# Patient Record
Sex: Female | Born: 1950 | Race: White | Hispanic: No
Health system: Southern US, Community
[De-identification: ages and names within clinical notes are randomized; demographics above are authoritative.]

---

## 2015-11-27 ENCOUNTER — Inpatient Hospital Stay (HOSPITAL_COMMUNITY): Payer: Self-pay

## 2015-11-27 ENCOUNTER — Inpatient Hospital Stay (HOSPITAL_COMMUNITY): Payer: MEDICAID

## 2015-11-27 ENCOUNTER — Inpatient Hospital Stay (HOSPITAL_COMMUNITY)
Admission: AD | Admit: 2015-11-27 | Discharge: 2015-12-01 | DRG: 208 | Disposition: A | Discharge status: Home / Self Care | Payer: Self-pay | Source: Other Acute Inpatient Hospital | Attending: Internal Medicine | Admitting: Internal Medicine

## 2015-11-27 DIAGNOSIS — J9690 Respiratory failure, unspecified, unspecified whether with hypoxia or hypercapnia: Secondary | ICD-10-CM

## 2015-11-27 DIAGNOSIS — F17200 Nicotine dependence, unspecified, uncomplicated: Secondary | ICD-10-CM | POA: Diagnosis present

## 2015-11-27 DIAGNOSIS — I10 Essential (primary) hypertension: Secondary | ICD-10-CM | POA: Diagnosis present

## 2015-11-27 DIAGNOSIS — E878 Other disorders of electrolyte and fluid balance, not elsewhere classified: Secondary | ICD-10-CM | POA: Diagnosis present

## 2015-11-27 DIAGNOSIS — J1000 Influenza due to other identified influenza virus with unspecified type of pneumonia: Principal | ICD-10-CM | POA: Diagnosis present

## 2015-11-27 DIAGNOSIS — I509 Heart failure, unspecified: Secondary | ICD-10-CM

## 2015-11-27 DIAGNOSIS — Z978 Presence of other specified devices: Secondary | ICD-10-CM

## 2015-11-27 DIAGNOSIS — I959 Hypotension, unspecified: Secondary | ICD-10-CM | POA: Diagnosis not present

## 2015-11-27 DIAGNOSIS — D696 Thrombocytopenia, unspecified: Secondary | ICD-10-CM | POA: Diagnosis present

## 2015-11-27 DIAGNOSIS — E876 Hypokalemia: Secondary | ICD-10-CM | POA: Diagnosis present

## 2015-11-27 DIAGNOSIS — E871 Hypo-osmolality and hyponatremia: Secondary | ICD-10-CM | POA: Diagnosis present

## 2015-11-27 DIAGNOSIS — J9602 Acute respiratory failure with hypercapnia: Secondary | ICD-10-CM

## 2015-11-27 DIAGNOSIS — Z4659 Encounter for fitting and adjustment of other gastrointestinal appliance and device: Secondary | ICD-10-CM

## 2015-11-27 DIAGNOSIS — A419 Sepsis, unspecified organism: Secondary | ICD-10-CM

## 2015-11-27 DIAGNOSIS — J441 Chronic obstructive pulmonary disease with (acute) exacerbation: Secondary | ICD-10-CM | POA: Diagnosis present

## 2015-11-27 DIAGNOSIS — R5381 Other malaise: Secondary | ICD-10-CM | POA: Diagnosis not present

## 2015-11-27 DIAGNOSIS — J9600 Acute respiratory failure, unspecified whether with hypoxia or hypercapnia: Secondary | ICD-10-CM

## 2015-11-27 DIAGNOSIS — I5033 Acute on chronic diastolic (congestive) heart failure: Secondary | ICD-10-CM | POA: Diagnosis present

## 2015-11-27 DIAGNOSIS — J9601 Acute respiratory failure with hypoxia: Secondary | ICD-10-CM | POA: Diagnosis present

## 2015-11-27 DIAGNOSIS — J101 Influenza due to other identified influenza virus with other respiratory manifestations: Secondary | ICD-10-CM | POA: Insufficient documentation

## 2015-11-27 LAB — MAGNESIUM: Magnesium: 1.9 mg/dL (ref 1.7–2.4)

## 2015-11-27 LAB — COMPREHENSIVE METABOLIC PANEL
ALK PHOS: 70 U/L (ref 38–126)
ALT: 26 U/L (ref 14–54)
ANION GAP: 12 (ref 5–15)
AST: 45 U/L — ABNORMAL HIGH (ref 15–41)
Albumin: 2.8 g/dL — ABNORMAL LOW (ref 3.5–5.0)
BUN: 11 mg/dL (ref 6–20)
CALCIUM: 8.1 mg/dL — AB (ref 8.9–10.3)
CO2: 25 mmol/L (ref 22–32)
Chloride: 89 mmol/L — ABNORMAL LOW (ref 101–111)
Creatinine, Ser: 0.89 mg/dL (ref 0.44–1.00)
GFR calc non Af Amer: >60 mL/min (ref >60)
Glucose, Bld: 128 mg/dL — ABNORMAL HIGH (ref 65–99)
POTASSIUM: 3.3 mmol/L — AB (ref 3.5–5.1)
SODIUM: 126 mmol/L — AB (ref 135–145)
TOTAL PROTEIN: 6 g/dL — AB (ref 6.5–8.1)
Total Bilirubin: 0.2 mg/dL — ABNORMAL LOW (ref 0.3–1.2)

## 2015-11-27 LAB — MRSA PCR SCREENING: MRSA by PCR: NEGATIVE

## 2015-11-27 LAB — POCT I-STAT 3, ART BLOOD GAS (G3+)
ACID-BASE DEFICIT: 2 mmol/L (ref 0.0–2.0)
Bicarbonate: 24.5 mEq/L — ABNORMAL HIGH (ref 20.0–24.0)
O2 Saturation: 97 %
PH ART: 7.317 — AB (ref 7.350–7.450)
TCO2: 26 mmol/L (ref 0–100)
pCO2 arterial: 47.8 mmHg — ABNORMAL HIGH (ref 35.0–45.0)
pO2, Arterial: 96 mmHg (ref 80.0–100.0)

## 2015-11-27 LAB — PROCALCITONIN: PROCALCITONIN: 2.36 ng/mL

## 2015-11-27 LAB — CBC
HCT: 35.7 % — ABNORMAL LOW (ref 36.0–46.0)
HEMOGLOBIN: 12.2 g/dL (ref 12.0–15.0)
MCH: 30.9 pg (ref 26.0–34.0)
MCHC: 34.2 g/dL (ref 30.0–36.0)
MCV: 90.4 fL (ref 78.0–100.0)
PLATELETS: 140 10*3/uL — AB (ref 150–400)
RBC: 3.95 MIL/uL (ref 3.87–5.11)
RDW: 13.7 % (ref 11.5–15.5)
WBC: 12.7 10*3/uL — ABNORMAL HIGH (ref 4.0–10.5)

## 2015-11-27 LAB — TROPONIN I
TROPONIN I: 0.03 ng/mL (ref <0.031)
Troponin I: 0.05 ng/mL — ABNORMAL HIGH (ref <0.031)

## 2015-11-27 LAB — INFLUENZA PANEL BY PCR (TYPE A & B)
H1N1 flu by pcr: NOT DETECTED
INFLAPCR: POSITIVE — AB
INFLBPCR: NEGATIVE

## 2015-11-27 LAB — GLUCOSE, CAPILLARY
GLUCOSE-CAPILLARY: 131 mg/dL — AB (ref 65–99)
Glucose-Capillary: 139 mg/dL — ABNORMAL HIGH (ref 65–99)

## 2015-11-27 LAB — PHOSPHORUS: PHOSPHORUS: 2.8 mg/dL (ref 2.5–4.6)

## 2015-11-27 MED ORDER — MIDAZOLAM HCL 2 MG/2ML IJ SOLN: 2.0000 mg | INTRAMUSCULAR | Status: DC | PRN

## 2015-11-27 MED ORDER — FENTANYL CITRATE (PF) 100 MCG/2ML IJ SOLN
50.0000 ug | Freq: Once | INTRAMUSCULAR | Status: AC
  Administered 2015-11-27: 50 ug via INTRAVENOUS

## 2015-11-27 MED ORDER — SENNOSIDES 8.8 MG/5ML PO SYRP
5.0000 mL | ORAL_SOLUTION | Freq: Two times a day (BID) | ORAL | Status: DC | PRN
  Filled 2015-11-27: qty 5

## 2015-11-27 MED ORDER — ENOXAPARIN SODIUM 40 MG/0.4ML ~~LOC~~ SOLN
40.0000 mg | SUBCUTANEOUS | Status: DC
  Administered 2015-11-27 – 2015-11-30 (×4): 40 mg via SUBCUTANEOUS
  Filled 2015-11-27 (×5): qty 0.4

## 2015-11-27 MED ORDER — FUROSEMIDE 10 MG/ML IJ SOLN
40.0000 mg | Freq: Once | INTRAMUSCULAR | Status: AC
  Administered 2015-11-27: 40 mg via INTRAVENOUS
  Filled 2015-11-27: qty 4

## 2015-11-27 MED ORDER — FAMOTIDINE 40 MG/5ML PO SUSR
20.0000 mg | Freq: Two times a day (BID) | ORAL | Status: DC
  Administered 2015-11-27 – 2015-11-28 (×3): 20 mg
  Filled 2015-11-27 (×3): qty 2.5

## 2015-11-27 MED ORDER — ACETAMINOPHEN 160 MG/5ML PO SOLN
650.0000 mg | ORAL | Status: DC | PRN
  Administered 2015-11-27: 650 mg
  Filled 2015-11-27: qty 20.3

## 2015-11-27 MED ORDER — DEXTROSE 5 % IV SOLN
500.0000 mg | Freq: Every day | INTRAVENOUS | Status: DC
  Administered 2015-11-27: 500 mg via INTRAVENOUS
  Filled 2015-11-27: qty 500

## 2015-11-27 MED ORDER — ACETAMINOPHEN 650 MG RE SUPP: 650.0000 mg | RECTAL | Status: DC | PRN

## 2015-11-27 MED ORDER — MIDAZOLAM HCL 5 MG/ML IJ SOLN
0.0000 mg/h | INTRAMUSCULAR | Status: DC
  Administered 2015-11-27: 3 mg/h via INTRAVENOUS
  Administered 2015-11-27: 10 mg/h via INTRAVENOUS
  Filled 2015-11-27 (×3): qty 10

## 2015-11-27 MED ORDER — OSELTAMIVIR PHOSPHATE 6 MG/ML PO SUSR
75.0000 mg | Freq: Two times a day (BID) | ORAL | Status: DC
Start: 2015-11-27 — End: 2015-11-28
  Administered 2015-11-27 – 2015-11-28 (×3): 75 mg
  Filled 2015-11-27 (×4): qty 12.5

## 2015-11-27 MED ORDER — SODIUM CHLORIDE 0.9 % IV SOLN
25.0000 ug/h | INTRAVENOUS | Status: DC
  Administered 2015-11-27: 100 ug/h via INTRAVENOUS
  Filled 2015-11-27: qty 50

## 2015-11-27 MED ORDER — PREDNISONE 5 MG/ML PO CONC
40.0000 mg | Freq: Every day | ORAL | Status: DC
  Administered 2015-11-27 – 2015-11-29 (×3): 40 mg
  Filled 2015-11-27 (×3): qty 8

## 2015-11-27 MED ORDER — IPRATROPIUM-ALBUTEROL 0.5-2.5 (3) MG/3ML IN SOLN
3.0000 mL | RESPIRATORY_TRACT | Status: DC
  Administered 2015-11-27 – 2015-11-29 (×14): 3 mL via RESPIRATORY_TRACT
  Filled 2015-11-27 (×14): qty 3

## 2015-11-27 MED ORDER — CHLORHEXIDINE GLUCONATE 0.12% ORAL RINSE (MEDLINE KIT)
15.0000 mL | Freq: Two times a day (BID) | OROMUCOSAL | Status: DC
  Administered 2015-11-27: 15 mL via OROMUCOSAL

## 2015-11-27 MED ORDER — ANTISEPTIC ORAL RINSE SOLUTION (CORINZ)
7.0000 mL | OROMUCOSAL | Status: DC
  Administered 2015-11-27 – 2015-11-28 (×11): 7 mL via OROMUCOSAL

## 2015-11-27 MED ORDER — PREDNISONE 5 MG/ML PO CONC
40.0000 mg | Freq: Every day | ORAL | Status: DC
  Filled 2015-11-27: qty 8

## 2015-11-27 MED ORDER — DEXTROSE 5 % IV SOLN
1.0000 g | Freq: Every day | INTRAVENOUS | Status: DC
  Administered 2015-11-27 – 2015-11-28 (×2): 1 g via INTRAVENOUS
  Filled 2015-11-27 (×2): qty 10

## 2015-11-27 MED ORDER — BISACODYL 10 MG RE SUPP: 10.0000 mg | Freq: Every day | RECTAL | Status: DC | PRN

## 2015-11-27 MED ORDER — CHLORHEXIDINE GLUCONATE 0.12% ORAL RINSE (MEDLINE KIT)
15.0000 mL | Freq: Two times a day (BID) | OROMUCOSAL | Status: DC
  Administered 2015-11-27 – 2015-11-28 (×2): 15 mL via OROMUCOSAL

## 2015-11-27 MED ORDER — FENTANYL BOLUS VIA INFUSION
50.0000 ug | INTRAVENOUS | Status: DC | PRN
  Filled 2015-11-27: qty 50

## 2015-11-27 MED ORDER — ANTISEPTIC ORAL RINSE SOLUTION (CORINZ): 7.0000 mL | Freq: Four times a day (QID) | OROMUCOSAL | Status: DC

## 2015-11-27 MED ORDER — SODIUM CHLORIDE 0.9 % IV SOLN: 250.0000 mL | INTRAVENOUS | Status: DC | PRN

## 2015-11-27 MED ORDER — VITAL HIGH PROTEIN PO LIQD
1000.0000 mL | ORAL | Status: DC
  Administered 2015-11-27: 21:00:00
  Administered 2015-11-27: 1000 mL
  Administered 2015-11-28: 02:00:00

## 2015-11-27 NOTE — Progress Notes (Signed)
[facility]  Afternoon rounds Stable, weaning Echo pending  Start lasix Start tube feeds Likely extubation AM  [facility] [place], MD [place] [facility] Pager: [phone] Cell: [phone] After 3pm or if no response, call [phone]

## 2015-11-27 NOTE — Progress Notes (Signed)
Initial Nutrition Assessment  DOCUMENTATION CODES:   Obesity unspecified  INTERVENTION:   If patient unable to be extubated, recommend initiating enteral nutrition with Vital High Protein at 25 mL/hr with Prostat 30 mL BID on day 1.  On day 2, d/c Prostat and increase to goal rate of 50 mL/hr (1200 mL/day) to provide 1200 kcal, 105 grams of protein, and 1003 mL of water.  NUTRITION DIAGNOSIS:   Inadequate oral intake related to inability to eat as evidenced by NPO status.  GOAL:   Patient will meet greater than or equal to 90% of their needs  MONITOR:   Vent status, Labs, Weight trends  REASON FOR ASSESSMENT:   Ventilator    ASSESSMENT:   65 yo with acute hypoxemic/hypercapneic respiratory failure and sepsis 2/2 presumed pulmonary source.  Hx of CHF and COPD. Pt was on CPAP briefly, but had clinical worsening with increased work of breathing and was intubated.    Patient is currently intubated on ventilator support. MV: 10.6 mL/min Temp (24 hrs); Avg: 98.5 F (36.9 C), Min: 98.2 F (36.8 C), Max: 98.6 F (37 C) OG Tube, tip at duodenal bulb  Nutrition Focused Physical Exam was conducted.  Findings included no fat depletion, no muscle depletion, and no edema.   Medications reviewed.  Labs reviewed and include sodium low 126,  potassium low 3.3.  Diet Order:  Diet NPO time specified  Skin:  Reviewed, no issues  Last BM:  Unknown  Height:   Ht Readings from Last 1 Encounters:  11/27/15  (1.6 m)    Weight:   Wt Readings from Last 1 Encounters:  11/27/15 196 lb 3.4 oz (89 kg)    Ideal Body Weight:  52.3 kg  BMI:  Body mass index is 34.77 kg/(m^2).  Estimated Nutritional Needs:   Kcal:  161-0960  Protein:  >/= 105 grams  Fluid:  >/= 1.5 L  EDUCATION NEEDS:   No education needs identified at this time  Doroteo Glassman, Dietetic Intern Pager: [phone]

## 2015-11-27 NOTE — Progress Notes (Signed)
Pt's temp 101.5 and called Dr. Jaquita Rector and he is aware.

## 2015-11-27 NOTE — Progress Notes (Signed)
  Echocardiogram 2D Echocardiogram has been performed.  Janalyn Harder 11/27/2015, 3:48 PM

## 2015-11-27 NOTE — H&P (Signed)
PULMONARY / CRITICAL CARE MEDICINE   Name: Taylor Mays MRN: [id number] DOB: 01-23-1951    ADMISSION DATE:  11/27/2015 CONSULTATION DATE:  11/27/15   CHIEF COMPLAINT:  Shortness of breath  HISTORY OF PRESENT ILLNESS:   Taylor Mays is a 24F who was brought to [facility] by EMS with complaints of shortness of breath. She reportedly had a history of CHF and COPD. She was given steroids, breathing treatments and nitro paste en route. On arrival to the ED she was febrile to 101.5, tachy in the 120s, tachypneic in the 40s adn hypertensive at 163/80. Sats were 88% on room air. She was placed on BiPAP with some improvement.  She was then transitioned to CPAP briefly, but had clinical worsening with increased work of breathing and use of accessory muscles. The decision was ultimately made to intubate. She was dyssnchronous and a versed gtt was uptitrated. She was on  /hr on arrival here.   Labs were remarkable for LA 3.4, trop 0.07,   WBC 14.7, Hgb 14.3, PLT 166. INR 0.96. UA benign. ABG 7.30 / 45.5 / 205 / 24. Cr. 0.9, K 4.0, HCO3 27.3, Na 118, Cl 80. Repeat trop 0.08. BNP 2443. Repeat LA 1.2.   There is some mention in the chart of alcohol use and current smoking.     PAST MEDICAL HISTORY :  She  has no past medical history on Mays. Per report, CHF and COPD/Asthma  PAST SURGICAL HISTORY: She  has no past surgical history on Mays. Unable to obtain.   Allergies not on Mays  No current facility-administered medications on Mays prior to encounter.   No current outpatient prescriptions on Mays prior to encounter.   Home meds unknown.   FAMILY HISTORY:  Taylor Mays.  Unable to obtain   SOCIAL HISTORY: She   reportedly is an every day smoker.  ?  Alcohol use.   REVIEW OF SYSTEMS:   Unable to obtain  VITAL SIGNS: Pulse Rate:  [100] 100 (02/27 0417) SpO2:  [98 %] 98 % (02/27 0417) FiO2 (%):  [40 %] 40 % (02/27 0417)  HEMODYNAMICS:     VENTILATOR SETTINGS: Vent Mode:  [-] PRVC FiO2 (%):  [40 %] 40 % Set Rate:  [16 bmp] 16 bmp Vt Set:  [520 mL] 520 mL PEEP:  [5 cmH20] 5 cmH20 Plateau Pressure:  [24 cmH20] 24 cmH20  INTAKE / OUTPUT:    PHYSICAL EXAMINATION: Physical Exam: Pulse Rate:  [100] 100 (02/27 0417) SpO2:  [98 %] 98 % (02/27 0417) FiO2 (%):  [40 %] 40 % (02/27 0417)  General Well nourished, well developed, sedated, arousable intubated.   HEENT No gross abnormalities. Oropharynx clear. ETT in place  Pulmonary Coarse breath sounds bilaterally with expiratory wheeze. No rales or ronchi on anterior exam. Prolonged expiratory phase. Vent-assisted effort, symmetrical expansion.   Cardiovascular Tachycardic, regular rhythm. S1, s2. No m/r/g. Distal pulses palpable.  Abdomen Soft, non-tender, non-distended, positive bowel sounds, no palpable organomegaly or masses. Normoresonant to percussion.  Musculoskeletal Moves all extremities. No bony abnormalities.  Lymphatics No cervical, supraclavicular or axillary adenopathy.   Neurologic Grossly intact. No focal deficits. Arouses easily  Skin/Integuement No rash, no cyanosis, no clubbing.     LABS:  BMET No results for input(s): NA, K, CL, CO2, BUN, CREATININE, GLUCOSE in the last 168 hours.  Electrolytes No results for input(s): CALCIUM, MG, PHOS in the last 168 hours.  CBC No results for input(s): WBC,  HGB, HCT, PLT in the last 168 hours.  Coag's No results for input(s): APTT, INR in the last 168 hours.  Sepsis Markers No results for input(s): LATICACIDVEN, PROCALCITON, O2SATVEN in the last 168 hours.  ABG No results for input(s): PHART, PCO2ART, PO2ART in the last 168 hours.  Liver Enzymes No results for input(s): AST, ALT, ALKPHOS, BILITOT, ALBUMIN in the last 168 hours.  Cardiac Enzymes No results for input(s): TROPONINI, PROBNP in the last 168 hours.  Glucose No results for input(s): GLUCAP in the last 168 hours.  Imaging No results  found. CXR 11/26/15 Mild degree of nonspecific left retrocardiac pulmonary opacities. This may represent atelectasis or less likely an infectious etiology.   STUDIES:  TTE pending  CULTURES: Blood 2/27 >> Resp quant 2/27 >>  ANTIBIOTICS: Rocephin / azithro 2/27 >> (received one dose of levaquin at OSH)  LINES/TUBES: PIV  DISCUSSION: Taylor Mays is a 65F transferred from OSH with acute hypoxemic / hypercapneic respiratory failure and sepsis 2/2 presumed pulmonary source. She is unable to provide much information about Taylor past medical history, but reports include CHF and COPD. Taylor BNP was elevated at the OSH supporting acute decompensated CHF, although she does not look floridly hypervolemic on exam. She was febrile with elevated WBC, but had no other obvious source of infection (UA benign). Cultures are pending. Treat empirically for sepsis 2/2 CAP, COPD exacerbation, CHF exacerbation and collect additional data.   ASSESSMENT / PLAN:  PULMONARY A: Acute hypoxemic / hypercapneic respiratory failure requiring intubation Presumed COPD exacerbation - no pfts on Mays, but wheezy.   P:   Continue ventilatory support Wean as tolerated; daily SBT Check viral etiologies, tracheal aspirate Diurese Empiric coverage for CAP with ceftriaxone / azithromycin CXR Prednisone  daily x 5 days  CARDIOVASCULAR A:  Acute (on chronic?) heart failure exacerbation - BNP >2000 Tachycardia Hypertension - improved P:  Diurese PRNs for BP Monitor HR  RENAL A:   Hyponatremia / hypochloremia - likely 2/2 volume overload  P:   Diurese Trend lytes, Cr.  GASTROINTESTINAL A:   No acute issues P:   Stress ulcer ppx while intubated  HEMATOLOGIC A:   No acute issues P:  Trend CBC  INFECTIOUS A:   Sepsis 2/2 presumed pulmonary source P:   Empiric ceftriaxone / azithromycin  Empiric Tamiflu Await flu / viral panel / cultures  ENDOCRINE A:   No acute issues P:   Monitor  CBG  NEUROLOGIC A:   Agitation P:   RASS goal: 0 to -1; goal vent synchrony Fentanyl gtt Wean versed gtt to off -> intermittent    FAMILY  - Updates: No family available.   - Inter-disciplinary family meet or Palliative Care meeting due by:  day 7  The patient is critically ill with multiple organ system failure and requires high complexity decision making for assessment and support, frequent evaluation and titration of therapies, advanced monitoring, review of radiographic studies and interpretation of complex data.   Critical Care Time devoted to patient care services, exclusive of separately billable procedures, described in this note is 50 minutes.   Nita Sickle, MD Pulmonary and Critical Care Medicine [facility] Pager: [phone]  11/27/2015, 4:27 AM

## 2015-11-28 ENCOUNTER — Inpatient Hospital Stay (HOSPITAL_COMMUNITY): Payer: MEDICAID

## 2015-11-28 DIAGNOSIS — J9601 Acute respiratory failure with hypoxia: Secondary | ICD-10-CM

## 2015-11-28 DIAGNOSIS — J101 Influenza due to other identified influenza virus with other respiratory manifestations: Secondary | ICD-10-CM | POA: Insufficient documentation

## 2015-11-28 DIAGNOSIS — J9602 Acute respiratory failure with hypercapnia: Secondary | ICD-10-CM

## 2015-11-28 LAB — COMPREHENSIVE METABOLIC PANEL
ALT: 25 U/L (ref 14–54)
AST: 39 U/L (ref 15–41)
Albumin: 2.6 g/dL — ABNORMAL LOW (ref 3.5–5.0)
Alkaline Phosphatase: 64 U/L (ref 38–126)
Anion gap: 13 (ref 5–15)
BUN: 12 mg/dL (ref 6–20)
CHLORIDE: 89 mmol/L — AB (ref 101–111)
CO2: 25 mmol/L (ref 22–32)
CREATININE: 0.99 mg/dL (ref 0.44–1.00)
Calcium: 8.2 mg/dL — ABNORMAL LOW (ref 8.9–10.3)
GFR calc Af Amer: >60 mL/min (ref >60)
GFR, EST NON AFRICAN AMERICAN: 59 mL/min — AB (ref >60)
Glucose, Bld: 131 mg/dL — ABNORMAL HIGH (ref 65–99)
POTASSIUM: 3.1 mmol/L — AB (ref 3.5–5.1)
SODIUM: 127 mmol/L — AB (ref 135–145)
Total Bilirubin: 0.2 mg/dL — ABNORMAL LOW (ref 0.3–1.2)
Total Protein: 5.6 g/dL — ABNORMAL LOW (ref 6.5–8.1)

## 2015-11-28 LAB — POCT I-STAT 3, ART BLOOD GAS (G3+)
BICARBONATE: 26.8 meq/L — AB (ref 20.0–24.0)
O2 Saturation: 98 %
PCO2 ART: 50.6 mmHg — AB (ref 35.0–45.0)
PH ART: 7.331 — AB (ref 7.350–7.450)
Patient temperature: 98.4
TCO2: 28 mmol/L (ref 0–100)
pO2, Arterial: 124 mmHg — ABNORMAL HIGH (ref 80.0–100.0)

## 2015-11-28 LAB — LACTIC ACID, PLASMA
LACTIC ACID, VENOUS: 1.9 mmol/L (ref 0.5–2.0)
LACTIC ACID, VENOUS: 1.5 mmol/L (ref 0.5–2.0)

## 2015-11-28 LAB — RESPIRATORY VIRUS PANEL
Adenovirus: NEGATIVE
Influenza A: POSITIVE — AB
Influenza B: NEGATIVE
Metapneumovirus: NEGATIVE
Parainfluenza 1: NEGATIVE
Parainfluenza 2: NEGATIVE
Parainfluenza 3: NEGATIVE
Respiratory Syncytial Virus A: NEGATIVE
Respiratory Syncytial Virus B: NEGATIVE
Rhinovirus: NEGATIVE

## 2015-11-28 LAB — CBC
HCT: 35.6 % — ABNORMAL LOW (ref 36.0–46.0)
Hemoglobin: 11.8 g/dL — ABNORMAL LOW (ref 12.0–15.0)
MCH: 30.2 pg (ref 26.0–34.0)
MCHC: 33.1 g/dL (ref 30.0–36.0)
MCV: 91 fL (ref 78.0–100.0)
PLATELETS: 138 10*3/uL — AB (ref 150–400)
RBC: 3.91 MIL/uL (ref 3.87–5.11)
RDW: 13.9 % (ref 11.5–15.5)
WBC: 10.6 10*3/uL — AB (ref 4.0–10.5)

## 2015-11-28 LAB — GLUCOSE, CAPILLARY
GLUCOSE-CAPILLARY: 180 mg/dL — AB (ref 65–99)
Glucose-Capillary: 138 mg/dL — ABNORMAL HIGH (ref 65–99)
Glucose-Capillary: 132 mg/dL — ABNORMAL HIGH (ref 65–99)
Glucose-Capillary: 141 mg/dL — ABNORMAL HIGH (ref 65–99)

## 2015-11-28 LAB — PROCALCITONIN: PROCALCITONIN: 1.35 ng/mL

## 2015-11-28 LAB — CORTISOL: CORTISOL PLASMA: 12.9 ug/dL

## 2015-11-28 MED ORDER — POTASSIUM CHLORIDE CRYS ER 20 MEQ PO TBCR
40.0000 meq | EXTENDED_RELEASE_TABLET | Freq: Once | ORAL | Status: AC
  Administered 2015-11-28: 40 meq via ORAL
  Filled 2015-11-28: qty 2

## 2015-11-28 MED ORDER — CETYLPYRIDINIUM CHLORIDE 0.05 % MT LIQD
7.0000 mL | Freq: Two times a day (BID) | OROMUCOSAL | Status: DC
  Administered 2015-11-28 – 2015-11-30 (×5): 7 mL via OROMUCOSAL

## 2015-11-28 MED ORDER — CHLORHEXIDINE GLUCONATE 0.12 % MT SOLN
15.0000 mL | Freq: Two times a day (BID) | OROMUCOSAL | Status: DC
  Administered 2015-11-28 – 2015-11-30 (×6): 15 mL via OROMUCOSAL
  Filled 2015-11-28 (×4): qty 15

## 2015-11-28 MED ORDER — SODIUM CHLORIDE 0.9 % IV SOLN
Freq: Once | INTRAVENOUS | Status: AC
  Administered 2015-11-28: 06:00:00 via INTRAVENOUS

## 2015-11-28 MED ORDER — OSELTAMIVIR PHOSPHATE 75 MG PO CAPS
75.0000 mg | ORAL_CAPSULE | Freq: Two times a day (BID) | ORAL | Status: DC
  Administered 2015-11-28 – 2015-12-01 (×6): 75 mg via ORAL
  Filled 2015-11-28 (×10): qty 1

## 2015-11-28 MED ORDER — FENTANYL CITRATE (PF) 100 MCG/2ML IJ SOLN: 12.5000 ug | INTRAMUSCULAR | Status: DC | PRN

## 2015-11-28 NOTE — Progress Notes (Signed)
Pt's BP 69/46 with MAP 55. Dr. Jaquita Rector made aware and new orders carried out.

## 2015-11-28 NOTE — Care Management Note (Addendum)
Case Management Note  Patient Details  Name: Taylor Mays MRN: [id number] Date of Birth: [birth year]  Subjective/Objective:         Pt admitted with acute respiratory failure with hypoxemia           Action/Plan:  PTA- pt was independent - pt lives with elderly mother in [place].  CM will continue to monitor for disposition needs   Expected Discharge Date:                  Expected Discharge Plan:  Home/Self Care  In-House Referral:     Discharge planning Services  CM Consult  Post Acute Care Choice:    Choice offered to:     DME Arranged:    DME Agency:     HH Arranged:    HH Agency:     Status of Service:  In process, will continue to follow  Medicare Important Message Given:    Date Medicare IM Given:    Medicare IM give by:    Date Additional Medicare IM Given:    Additional Medicare Important Message give by:     If discussed at Long Length of Stay Meetings, dates discussed:    Additional Comments:  Cherylann Parr, RN 11/28/2015, 12:06 PM

## 2015-11-28 NOTE — Progress Notes (Signed)
eLink Physician-Brief Progress Note Patient Name: Taylor Mays DOB: 1951/07/28 MRN: [id number]   Date of Service  11/28/2015  HPI/Events of Note  RN called for low K. Also, pt with decreasing uo. BP 98/71. CXR -- no congestion. Echo with NEF  eICU Interventions  Replace K. Fluid bolus 500 ml x 1. May need maintenance fluid     Intervention Category Intermediate Interventions: Hypovolemia - evaluation and management  [name] 11/28/2015, 6:03 AM

## 2015-11-28 NOTE — Progress Notes (Signed)
Fentanyl 175 ml and 15 ml of Versed wasted in the sink. Full bag of 50 ml of Versed returned to Pharmacy. Witnessed by [employer], RN

## 2015-11-28 NOTE — Procedures (Signed)
Extubation Procedure Note  Patient Details:   Name: Taylor Mays DOB: Feb 08, 1951 MRN: [id number]   Airway Documentation:     Evaluation  O2 sats: stable throughout Complications: No apparent complications Patient did tolerate procedure well. Bilateral Breath Sounds: Rhonchi Suctioning: Airway Yes   Patient extubated to 4L nasal cannula per MD order.  Positive cuff leak noted.  No evidence of stridor.  Patient able to speak post extubation.  Sats currently 95%.  Vitals are stable.  No apparent complications.   Durwin Glaze 11/28/2015, 12:44 PM

## 2015-11-28 NOTE — Progress Notes (Signed)
PULMONARY / CRITICAL CARE MEDICINE   Name: Taylor Mays MRN: [id number] DOB: 1951-08-21    ADMISSION DATE:  11/27/2015 CONSULTATION DATE:  11/27/15   CHIEF COMPLAINT:  Shortness of breath  HISTORY OF PRESENT ILLNESS:   Taylor Mays is a 77F who was brought to [facility] by EMS with complaints of shortness of breath. She reportedly had a history of CHF and COPD. She was given steroids, breathing treatments and nitro paste en route. On arrival to the ED she was febrile to 101.5, tachy in the 120s, tachypneic in the 40s adn hypertensive at 163/80. Sats were 88% on room air. She was placed on BiPAP with some improvement.  She was then transitioned to CPAP briefly, but had clinical worsening with increased work of breathing and use of accessory muscles. The decision was ultimately made to intubate. She was dyssnchronous and a versed gtt was uptitrated.   SUBJECTIVE: Hypotensive overnight; received fluid bolus Patient is comfortable on vent and awake. No complaints  HEMODYNAMICS:    VENTILATOR SETTINGS: Vent Mode:  [-] PRVC FiO2 (%):  [40 %] 40 % Set Rate:  [18 bmp] 18 bmp Vt Set:  [520 mL] 520 mL PEEP:  [5 cmH20] 5 cmH20 Pressure Support:  [8 cmH20] 8 cmH20 Plateau Pressure:  [13 cmH20-17 cmH20] 17 cmH20  INTAKE / OUTPUT: I/O last 3 completed shifts: In: 501.5 [I.V.:229.5; NG/GT:222; IV Piggyback:50] Out: 1225 [Urine:1225]  PHYSICAL EXAMINATION: Temp:  [98.4 F (36.9 C)-101.8 F (38.8 C)] 98.4 F (36.9 C) (02/28 0313) Pulse Rate:  [51-126] 91 (02/28 0600) Resp:  [15-35] 18 (02/28 0600) BP: (69-153)/(46-92) 98/71 mmHg (02/28 0600) SpO2:  [94 %-100 %] 100 % (02/28 0600) FiO2 (%):  [40 %] 40 % (02/28 0400) Weight:  [194 lb 0.1 oz (88 kg)] 194 lb 0.1 oz (88 kg) (02/28 0347)  General Well nourished, well developed, awake,  intubated.   HEENT No gross abnormalities. Oropharynx clear. ETT in place  Pulmonary Coarse breath sounds bilaterally. No wheezes, rales or ronchi  on anterior exam. Prolonged expiratory phase. Normal work of breathing   Cardiovascular RRR. S1, s2. No m/r/g. Distal pulses palpable.  Abdomen Soft, non-tender, non-distended, positive bowel sounds, no palpable organomegaly or masses.   Musculoskeletal Moves all extremities. No bony abnormalities.  Neurologic Grossly intact. No focal deficits. Arouses easily  Skin/Integuement No rash, no cyanosis, no clubbing.     LABS:  BMET  Recent Labs Lab 11/27/15 0835 11/28/15 0213  NA 126* 127*  K 3.3* 3.1*  CL 89* 89*  CO2 25 25  BUN 11 12  CREATININE 0.89 0.99  GLUCOSE 128* 131*    Electrolytes  Recent Labs Lab 11/27/15 0835 11/28/15 0213  CALCIUM 8.1* 8.2*  MG 1.9  --   PHOS 2.8  --     CBC  Recent Labs Lab 11/27/15 0835 11/28/15 0213  WBC 12.7* 10.6*  HGB 12.2 11.8*  HCT 35.7* 35.6*  PLT 140* 138*    Coag's No results for input(s): APTT, INR in the last 168 hours.  Sepsis Markers  Recent Labs Lab 11/27/15 0835 11/28/15 0213 11/28/15 0442  LATICACIDVEN  --  1.9  --   PROCALCITON 2.36  --  1.35    ABG  Recent Labs Lab 11/27/15 0520 11/28/15 0402  PHART 7.317* 7.331*  PCO2ART 47.8* 50.6*  PO2ART 96.0 124.0*    Liver Enzymes  Recent Labs Lab 11/27/15 0835 11/28/15 0213  AST 45* 39  ALT 26 25  ALKPHOS 70 64  BILITOT  0.2* 0.2*  ALBUMIN 2.8* 2.6*    Cardiac Enzymes  Recent Labs Lab 11/27/15 0835 11/27/15 1052 11/27/15 1630  TROPONINI <0.03 0.03 0.05*    Glucose  Recent Labs Lab 11/27/15 0457 11/27/15 2059 11/27/15 2335 11/28/15 0319  GLUCAP 131* 139* 180* 138*    Imaging Dg Chest Port 1 View  11/28/2015  CLINICAL DATA:  Respiratory failure. EXAM: PORTABLE CHEST 1 VIEW COMPARISON:  11/27/2015. FINDINGS: Endotracheal tube and NG tube noted in stable position. Heart size stable. Low lung volumes with basilar atelectasis. Lower lobe infiltrates, particular in the the left lower lobe, cannot be excluded. No pleural effusion or  pneumothorax. IMPRESSION: 1. Lines and tubes in stable position. 2. Low lung volumes with bibasilar atelectasis. Lower lobe infiltrates, particularly the in the left lower lobe, cannot be excluded . Electronically Signed   By: Maisie Fus  Register   On: 11/28/2015 07:04   CXR 11/26/15 Mild degree of nonspecific left retrocardiac pulmonary opacities. This may represent atelectasis or less likely an infectious etiology.   STUDIES:  Echo 2/27 >>G1DD, normal EF  CULTURES: Blood 2/27 >> Resp quant 2/27 >>  ANTIBIOTICS: Rocephin 2/27 >> azithro 2/27 >> Tamiflu 2/27>> (received one dose of levaquin at OSH)  LINES/TUBES: PIV ETT  DISCUSSION: Taylor Mays is a 72F transferred from OSH with acute hypoxemic / hypercapneic respiratory failure and sepsis 2/2 presumed pulmonary source. She is unable to provide much information about her past medical history, but reports include CHF and COPD. Her BNP was elevated at the OSH supporting acute decompensated CHF, although she does not look floridly hypervolemic on exam. She was febrile with elevated WBC, but had no other obvious source of infection (UA benign). Cultures are pending. Treat empirically for sepsis 2/2 CAP, COPD exacerbation, CHF exacerbation and collect additional data.   ASSESSMENT / PLAN:  PULMONARY A: Acute hypoxemic / hypercapneic respiratory failure requiring intubation Presumed COPD exacerbation - no pfts on file, but wheezy.  Flu positive  P:   Continue ventilatory support Wean as tolerated; daily SBT Empiric coverage for CAP with ceftriaxone / azithromycin CXR with atelectasis  Prednisone  daily x 5 days Possible extubation today if passes wean  CARDIOVASCULAR A:  Acute (on chronic?) heart failure exacerbation - BNP >2000 Tachycardia - improved Hypertension - improved; now hypotensive P:  PRNs for BP Monitor HR Echo 2/27 with normal EF, grade 1 diastolic dysfunction   RENAL A:   Hyponatremia / hypochloremia -  likely 2/2 volume overload Hypokalemia P:   Continue Lasix Trend lytes, Cr. Replace lytes as needed Monitor UOP Goal neg balance  GASTROINTESTINAL A:   No acute issues P:   Stress ulcer ppx while intubated Continue TFs  HEMATOLOGIC A:   No acute issues Thrombocytopenia P:  Trend CBC  INFECTIOUS A:   Sepsis 2/2 presumed pulmonary source Flu positive P:   Empiric ceftriaxone / azithromycin  Tamiflu for 5 days Await viral panel / cultures Pct and lactic acid downtrending  ENDOCRINE A:   No acute issues P:   Monitor CBG Cortisol wnl  NEUROLOGIC A:   Agitation P:   RASS goal: 0 to -1; goal vent synchrony Fentanyl gtt Wean versed gtt to off   FAMILY  - Updates: No family available.    Caryl Ada, DO 11/28/2015, 7:25 AM PGY-2, [facility]

## 2015-11-29 DIAGNOSIS — R062 Wheezing: Secondary | ICD-10-CM

## 2015-11-29 DIAGNOSIS — R29898 Other symptoms and signs involving the musculoskeletal system: Secondary | ICD-10-CM

## 2015-11-29 LAB — BASIC METABOLIC PANEL
ANION GAP: 9 (ref 5–15)
CHLORIDE: 90 mmol/L — AB (ref 101–111)
CO2: 33 mmol/L — ABNORMAL HIGH (ref 22–32)
Calcium: 8.7 mg/dL — ABNORMAL LOW (ref 8.9–10.3)
Creatinine, Ser: 0.64 mg/dL (ref 0.44–1.00)
Glucose, Bld: 138 mg/dL — ABNORMAL HIGH (ref 65–99)
POTASSIUM: 3.7 mmol/L (ref 3.5–5.1)
SODIUM: 132 mmol/L — AB (ref 135–145)

## 2015-11-29 MED ORDER — MOMETASONE FURO-FORMOTEROL FUM 100-5 MCG/ACT IN AERO
2.0000 | INHALATION_SPRAY | Freq: Two times a day (BID) | RESPIRATORY_TRACT | Status: DC
  Filled 2015-11-29: qty 8.8

## 2015-11-29 MED ORDER — MOMETASONE FURO-FORMOTEROL FUM 100-5 MCG/ACT IN AERO
2.0000 | INHALATION_SPRAY | Freq: Two times a day (BID) | RESPIRATORY_TRACT | Status: DC
  Administered 2015-11-29 – 2015-12-01 (×4): 2 via RESPIRATORY_TRACT
  Filled 2015-11-29: qty 8.8

## 2015-11-29 MED ORDER — LISINOPRIL 5 MG PO TABS
5.0000 mg | ORAL_TABLET | Freq: Every day | ORAL | Status: DC
  Administered 2015-11-29 – 2015-12-01 (×3): 5 mg via ORAL
  Filled 2015-11-29 (×3): qty 1

## 2015-11-29 MED ORDER — IPRATROPIUM-ALBUTEROL 0.5-2.5 (3) MG/3ML IN SOLN: 3.0000 mL | RESPIRATORY_TRACT | Status: DC | PRN

## 2015-11-29 MED ORDER — PREDNISONE 20 MG PO TABS
30.0000 mg | ORAL_TABLET | Freq: Every day | ORAL | Status: DC
  Administered 2015-11-30 – 2015-12-01 (×2): 30 mg via ORAL
  Filled 2015-11-29 (×2): qty 1

## 2015-11-29 NOTE — Progress Notes (Signed)
Nutrition Follow-up  DOCUMENTATION CODES:   Obesity unspecified  INTERVENTION:   - Provide Mighty Shake po BID with meals, each supplement provides 500 kcal and 23 grams of protein.  - Provide afternoon snack to help patient meet needs.  NUTRITION DIAGNOSIS:   Inadequate oral intake related to poor appetite as evidenced by per patient/family report.  Ongoing  GOAL:   Patient will meet greater than or equal to 90% of their needs  Unmet  MONITOR:   PO intake, Supplement acceptance, Labs, Weight trends  ASSESSMENT:   65 yo with acute hypoxemic/hypercapneic respiratory failure and sepsis 2/2 presumed pulmonary source.  Hx of CHF and COPD. Pt was on CPAP briefly, but had clinical worsening with increased work of breathing and was intubated.   Patient was extubated and tube feedings were stopped on 2/28.  Pt reports poor appetite for about 1 week.  States that prior to this time she had a good appetite and was consuming mostly snacks throughout the day.  Pt reports that she ate about half of the bacon and fruit on her breakfast tray but no eggs or grits.  Her lunch tray was in the room during the visit.  Pt had a few bites of her burger and dessert, about 25% meal completion.    Reports usual body weight of 170#, current weight of 190# from today.  States that she may have lost some weight, although this cannot be verified from chart review.  Labs reviewed: CBGs elevated (132-141), low sodium (127), low potassium (3.1).  Patient is not currently meeting needs.  Will provide Mighty Shakes BID and an afternoon snack to help patient meet kcal and protein needs.   Diet Order:  Diet regular Room service appropriate?: Yes; Fluid consistency:: Thin  Skin:  Reviewed, no issues  Last BM:  2/28  Height:   Ht Readings from Last 1 Encounters:  11/27/15  (1.6 m)    Weight:   Wt Readings from Last 1 Encounters:  11/29/15 190 lb 4.1 oz (86.3 kg)    Ideal Body Weight:  52.3  kg  BMI:  Body mass index is 33.71 kg/(m^2).  Estimated Nutritional Needs:   Kcal:  1700-1900  Protein:  85-95 grams  Fluid:  1.7-1.9 L  EDUCATION NEEDS:   Education needs addressed  Doroteo Glassman, Dietetic Intern Pager: [phone]

## 2015-11-29 NOTE — Progress Notes (Signed)
PULMONARY / CRITICAL CARE MEDICINE   Name: Taylor Mays MRN: [id number] DOB: [birth year]    ADMISSION DATE:  11/27/2015 CONSULTATION DATE:  11/27/15   CHIEF COMPLAINT:  Shortness of breath  BRIEF:  65 y/o female with influenza and a COPD exacerbation (presumed, has no baseline PFTs but has smoked).  Extubated 2/28.   SUBJECTIVE: Extubated yesterday  HEMODYNAMICS:    VENTILATOR SETTINGS:    INTAKE / OUTPUT: I/O last 3 completed shifts: In: 1232.1 [I.V.:382.1; NG/GT:550; IV Piggyback:300] Out: 1525 [Urine:1525]  PHYSICAL EXAMINATION: Temp:  [98.2 F (36.8 C)-98.9 F (37.2 C)] 98.6 F (37 C) (03/01 1117) Pulse Rate:  [57-111] 92 (03/01 0900) Resp:  [15-26] 23 (03/01 0900) BP: (125-161)/(73-105) 148/85 mmHg (03/01 0900) SpO2:  [92 %-100 %] 98 % (03/01 0900) Weight:  [86.3 kg (190 lb 4.1 oz)] 86.3 kg (190 lb 4.1 oz) (03/01 0346)   Gen: chronically ill appearing, weak, sitting up in chair HENT: NCAT OP clear PULM : No wheezing today, normal effort CV: RRR, no mgr GI: BS+, soft, nontender MSK: normal bulk and tone Derm: no edema Neuro: A&Ox4, maew  LABS:  BMET  Recent Labs Lab 11/27/15 0835 11/28/15 0213  NA 126* 127*  K 3.3* 3.1*  CL 89* 89*  CO2 25 25  BUN 11 12  CREATININE 0.89 0.99  GLUCOSE 128* 131*    Electrolytes  Recent Labs Lab 11/27/15 0835 11/28/15 0213  CALCIUM 8.1* 8.2*  MG 1.9  --   PHOS 2.8  --     CBC  Recent Labs Lab 11/27/15 0835 11/28/15 0213  WBC 12.7* 10.6*  HGB 12.2 11.8*  HCT 35.7* 35.6*  PLT 140* 138*    Coag's No results for input(s): APTT, INR in the last 168 hours.  Sepsis Markers  Recent Labs Lab 11/27/15 0835 11/28/15 0213 11/28/15 0442  LATICACIDVEN  --  1.9 1.5  PROCALCITON 2.36  --  1.35    ABG  Recent Labs Lab 11/27/15 0520 11/28/15 0402  PHART 7.317* 7.331*  PCO2ART 47.8* 50.6*  PO2ART 96.0 124.0*    Liver Enzymes  Recent Labs Lab 11/27/15 0835 11/28/15 0213  AST 45* 39  ALT  26 25  ALKPHOS 70 64  BILITOT 0.2* 0.2*  ALBUMIN 2.8* 2.6*    Cardiac Enzymes  Recent Labs Lab 11/27/15 0835 11/27/15 1052 11/27/15 1630  TROPONINI <0.03 0.03 0.05*    Glucose  Recent Labs Lab 11/27/15 0457 11/27/15 2059 11/27/15 2335 11/28/15 0319 11/28/15 0741 11/28/15 1133  GLUCAP 131* 139* 180* 138* 132* 141*    Imaging No results found. CXR 11/26/15 Mild degree of nonspecific left retrocardiac pulmonary opacities. This may represent atelectasis or less likely an infectious etiology.   STUDIES:  Echo 2/27 >>G1DD, normal EF  CULTURES: Blood 2/27 >> Resp viral panel 2/27 >> FLU A  ANTIBIOTICS: Rocephin 2/27 >> 2/28 azithro 2/27 >> 2/28 Tamiflu 2/27>> (received one dose of levaquin at OSH)  LINES/TUBES: PIV ETT > 2/28  DISCUSSION: 65 y/o female with influenza and a COPD exacerbation (presumed, has no baseline PFTs but has smoked).  Extubated 2/28.  Also possibly diastolic heart failure exacerbation.  ASSESSMENT / PLAN:  PULMONARY A: Acute hypoxemic / hypercapneic respiratory failure requiring intubation > improved Presumed COPD exacerbation - no pfts on file, but had significant wheezing Flu positive  P:   Wean O2 for O2 saturation > 90% Continue duoneb prn Add Dulera Needs outpatient PFT Decrease prednisone to  tomorrow, then taper  CARDIOVASCULAR A:  Acute (on  chronic?) diastolic heart failure exacerbation - BNP >2000 Hypertension - improved P:  Hold lasix tele Start lisinopril daily for afterload reduction  RENAL A:   Hyponatremia / hypochloremia  Hypokalemia  P:   Send repeat BMET today Monitor BMET and UOP Replace electrolytes as needed  GASTROINTESTINAL A:   No acute issues P:   Stress ulcer ppx while intubated Continue TFs  HEMATOLOGIC A:   No acute issues Thrombocytopenia P:  Trend CBC  INFECTIOUS A:   Flu pneumonia P:   Hold off of antibiotics Tamiflu for 5 days  ENDOCRINE A:   No acute  issues P:   Monitor CBG  NEUROLOGIC A:   Deconditioning P:   PT consult   FAMILY  - Updates: No family available.   Heber Langley, MD [facility] Pager: [phone] Cell: [phone] After 3pm or if no response, call [phone]

## 2015-11-30 LAB — BASIC METABOLIC PANEL
ANION GAP: 8 (ref 5–15)
BUN: <5 mg/dL — ABNORMAL LOW (ref 6–20)
CALCIUM: 8.7 mg/dL — AB (ref 8.9–10.3)
CO2: 32 mmol/L (ref 22–32)
CREATININE: 0.54 mg/dL (ref 0.44–1.00)
Chloride: 92 mmol/L — ABNORMAL LOW (ref 101–111)
Glucose, Bld: 108 mg/dL — ABNORMAL HIGH (ref 65–99)
Potassium: 3.4 mmol/L — ABNORMAL LOW (ref 3.5–5.1)
SODIUM: 132 mmol/L — AB (ref 135–145)

## 2015-11-30 LAB — CBC WITH DIFFERENTIAL/PLATELET
Basophils Absolute: 0.1 10*3/uL (ref 0.0–0.1)
Basophils Relative: 1 %
EOS ABS: 0.1 10*3/uL (ref 0.0–0.7)
Eosinophils Relative: 1 %
HCT: 36.8 % (ref 36.0–46.0)
Hemoglobin: 12.3 g/dL (ref 12.0–15.0)
LYMPHS PCT: 18 %
Lymphs Abs: 2 10*3/uL (ref 0.7–4.0)
MCH: 30.9 pg (ref 26.0–34.0)
MCHC: 33.4 g/dL (ref 30.0–36.0)
MCV: 92.5 fL (ref 78.0–100.0)
MONO ABS: 1.2 10*3/uL — AB (ref 0.1–1.0)
Monocytes Relative: 11 %
NEUTROS PCT: 69 %
Neutro Abs: 7.7 10*3/uL (ref 1.7–7.7)
PLATELETS: 223 10*3/uL (ref 150–400)
RBC: 3.98 MIL/uL (ref 3.87–5.11)
RDW: 13.8 % (ref 11.5–15.5)
WBC: 11.1 10*3/uL — ABNORMAL HIGH (ref 4.0–10.5)

## 2015-11-30 MED ORDER — POTASSIUM CHLORIDE CRYS ER 20 MEQ PO TBCR
40.0000 meq | EXTENDED_RELEASE_TABLET | Freq: Once | ORAL | Status: AC
  Administered 2015-11-30: 40 meq via ORAL
  Filled 2015-11-30: qty 2

## 2015-11-30 NOTE — Evaluation (Signed)
Physical Therapy Evaluation Patient Details Name: Taylor Mays MRN: [id number] DOB: 1951/06/15 Today's Date: 11/30/2015   History of Present Illness  Patient is a 65 y/o female with hx of CHF and COPD presents with SOB and intubated 2/27-2/28. Admitted with Acute respiratory failure with hypoxemia> likely COPD exacerbation due to Flu A and pneumonia.  Clinical Impression  Patient presents with decreased strength, balance, endurance, dyspnea on exertion requiring 02 and decreased activity tolerance impacting mobility. Tolerated short distance ambulation and prolonged standing with Supervision-Min guard. May need RW for support at home initially. Pt independent PTA and lives with 24 y/o mother. Motivated to return to PLOF. Will follow acutely to maximize independence and mobility prior to return home.     Follow Up Recommendations Home health PT;Supervision - Intermittent    Equipment Recommendations  Rolling walker with 5" wheels    Recommendations for Other Services OT consult     Precautions / Restrictions Precautions Precautions: Fall Restrictions Weight Bearing Restrictions: No      Mobility  Bed Mobility Overal bed mobility: Needs Assistance Bed Mobility: Supine to Sit     Supine to sit: Modified independent (Device/Increase time);HOB elevated     General bed mobility comments: No assist needed, use of rail. Impulsively as pt needing to go to bathroom.  Transfers Overall transfer level: Needs assistance Equipment used: Rolling walker (2 wheeled) Transfers: Sit to/from Stand Sit to Stand: Supervision         General transfer comment: Supervision for safety. Stood from [employer], from toilet x1. Transferred to chair post ambulation bout.  Ambulation/Gait Ambulation/Gait assistance: Min guard Ambulation Distance (Feet): 40 Feet Assistive device: Rolling walker (2 wheeled) Gait Pattern/deviations: Step-through pattern;Decreased stride length;Trunk flexed Gait  velocity: decreased Gait velocity interpretation: Below normal speed for age/gender General Gait Details: Pt picking up RW at times; cues to keep RW on ground. Mostly steady with dyspnea on exertion of 2/4. Ambulated on 3L/min 02.   Stairs            Wheelchair Mobility    Modified Rankin (Stroke Patients Only)       Balance Overall balance assessment: Needs assistance Sitting-balance support: Feet supported;No upper extremity supported Sitting balance-Leahy Scale: Good Sitting balance - Comments: Able to perform pericare reaching outside BoS without difficulty.    Standing balance support: During functional activity Standing balance-Leahy Scale: Fair Standing balance comment: Able to stand unsupported to change gown and during assisted washing of back without LOB. Stood for ~1-2 minutes. Able to stand at sink unsupported and perform hand hygiene.                             Pertinent Vitals/Pain Pain Assessment: No/denies pain    Home Living Family/patient expects to be discharged to:: Private residence Living Arrangements: Parent (with 26 y/o mother who is in good health) Available Help at Discharge: Family;Available 24 hours/day Type of Home: House Home Access: Level entry     Home Layout: One level Home Equipment: None      Prior Function Level of Independence: Independent         Comments: Mows lawn, works in yard, does not drive. neighbor assists with driving/grocery shopping. Reports her Lurlean Leyden has pulled her down a few times.      Hand Dominance        Extremity/Trunk Assessment   Upper Extremity Assessment: Defer to OT evaluation  Lower Extremity Assessment: Generalized weakness         Communication   Communication: No difficulties  Cognition Arousal/Alertness: Awake/alert Behavior During Therapy: WFL for tasks assessed/performed Overall Cognitive Status: Within Functional Limits for tasks assessed                       General Comments General comments (skin integrity, edema, etc.): Pt incontinent of stool in bed; changed gown, sheet and assisted with cleaning pt.    Exercises        Assessment/Plan    PT Assessment Patient needs continued PT services  PT Diagnosis Difficulty walking   PT Problem List Decreased strength;Cardiopulmonary status limiting activity;Decreased activity tolerance;Decreased balance;Decreased mobility;Decreased knowledge of use of DME  PT Treatment Interventions Balance training;DME instruction;Gait training;Therapeutic activities;Therapeutic exercise;Functional mobility training;Patient/family education   PT Goals (Current goals can be found in the Care Plan section) Acute Rehab PT Goals Patient Stated Goal: to return to PLOF PT Goal Formulation: With patient Time For Goal Achievement: 12/14/15 Potential to Achieve Goals: Good    Frequency Min 3X/week   Barriers to discharge        Co-evaluation               End of Session Equipment Utilized During Treatment: Gait belt;Oxygen Activity Tolerance: Patient tolerated treatment well Patient left: in chair;with call bell/phone within reach;with chair alarm set;with nursing/sitter in room Nurse Communication: Mobility status         Time: 1610-9604 PT Time Calculation (min) (ACUTE ONLY): 23 min   Charges:   PT Evaluation $PT Eval Moderate Complexity: 1 Procedure PT Treatments $Gait Training: 8-22 mins   PT G Codes:        Khaliah Barnick A Jameel Quant 11/30/2015, 10:55 AM Mylo Red, PT, DPT [phone]

## 2015-11-30 NOTE — Progress Notes (Signed)
Name: Taylor Mays MRN: [id number] DOB: 03-23-1951    ADMISSION DATE:  11/27/2015   CONSULTATION DATE: 11/27/15  REFERRING MD :  Dr. Bary Richard  CHIEF COMPLAINT: Shortness of breath     BRIEF PATIENT DESCRIPTION: 65 y/o female with influenza and a COPD exacerbation (presumed, has no baseline PFTs but has smoked). Extubated 2/28.  SIGNIFICANT EVENTS   Intubated 11/27/15  Extubated 11/29/15  STUDIES:  Echo 2/27 >>G1DD, normal EF   HISTORY OF PRESENT ILLNESS:  Taylor Mays is a 65F who was brought to [facility] by EMS with complaints of shortness of breath. She reportedly had a history of CHF and COPD. She was given steroids, breathing treatments and nitro paste en route. On arrival to the ED she was febrile to 101.5, tachy in the 120s, tachypneic in the 40s adn hypertensive at 163/80. Sats were 88% on room air. She was placed on BiPAP with some improvement. She was then transitioned to CPAP briefly, but had clinical worsening with increased work of breathing and use of accessory muscles. The decision was ultimately made to intubate. She was dyssnchronous and a versed gtt was uptitrated. She was extubated on 11/28/15 and moved to SD   PAST MEDICAL HISTORY :   has no past medical history on file.  has no past surgical history on file. Prior to Admission medications   Not on File   No Known Allergies  FAMILY HISTORY:  family history is not on file. SOCIAL HISTORY:    REVIEW OF SYSTEMS:   Constitutional: Negative for fever, chills, weight loss, malaise/fatigue and diaphoresis.  HENT: Negative for hearing loss, ear pain, nosebleeds, congestion, sore throat, neck pain, tinnitus and ear discharge.   Eyes: Negative for blurred vision, double vision, photophobia, pain, discharge and redness.  Respiratory: Negative for cough, hemoptysis, sputum production, shortness of breath, wheezing and stridor.   Cardiovascular: Negative for chest pain, palpitations, orthopnea,  claudication, leg swelling and PND.  Gastrointestinal: Negative for heartburn, nausea, vomiting, abdominal pain, diarrhea, constipation, blood in stool and melena.  Genitourinary: Negative for dysuria, urgency, frequency, hematuria and flank pain.  Musculoskeletal: Negative for myalgias, back pain, joint pain and falls.  Skin: Negative for itching and rash.  Neurological: Negative for dizziness, tingling, tremors, sensory change, speech change, focal weakness, seizures, loss of consciousness, weakness and headaches.  Endo/Heme/Allergies: Negative for environmental allergies and polydipsia. Does not bruise/bleed easily.  SUBJECTIVE:  Patient was sitting on the chair, was on 3l of oxygen. Says feels much better now.  VITAL SIGNS: Temp:  [98 F (36.7 C)-98.6 F (37 C)] 98.5 F (36.9 C) (03/02 0451) Pulse Rate:  [81-102] 89 (03/02 0451) Resp:  [15-20] 16 (03/02 0451) BP: (101-169)/(71-95) 125/77 mmHg (03/02 0451) SpO2:  [95 %-100 %] 99 % (03/02 0737) Weight:  [188 lb 4.4 oz (85.4 kg)] 188 lb 4.4 oz (85.4 kg) (03/02 0451)  PHYSICAL EXAMINATION: General:  Elderly white female, sitting on the recliner Neuro:  Awake, alert,PERRL HEENT:  Atraumatic, normocephalic,sclera white, no discharge Cardiovascular:  S1,S2, rrr, no MRG noted Lungs:  Diminished bases but otherwise clear, no wheezes crackles,rales Abdomen: obese, protrubent, soft, nontender Musculoskeletal:  Good tone Skin: no rash , ecchymosis, skin intact   Recent Labs Lab 11/28/15 0213 11/29/15 1411 11/30/15 0345  NA 127* 132* 132*  K 3.1* 3.7 3.4*  CL 89* 90* 92*  CO2 25 33* 32  BUN 12 <5* <5*  CREATININE 0.99 0.64 0.54  GLUCOSE 131* 138* 108*    Recent Labs Lab  11/27/15 0835 11/28/15 0213 11/30/15 0345  HGB 12.2 11.8* 12.3  HCT 35.7* 35.6* 36.8  WBC 12.7* 10.6* 11.1*  PLT 140* 138* 223   No results found.  ASSESSMENT / PLAN: A Acute hypoxemic / hypercapneic respiratory failure requiring intubation >  improved Presumed COPD exacerbation - no pfts on file, but had significant wheezing Flu positive P Extubated on 2/28 Wean O2 as tolerated. Keep O2 sats>90% Continue dulera/duoneb Continue Tamiflu Started prednisone  on 11/30/15,then taper Need PFT as outpatient  A Acute (on chronic?) diastolic heart failure exacerbation - BNP >2000 Hypertension - improved   P Continue lisinopril Continue to hold lasix Echo  2/27 with normal EF, grade 1 diastolic dysfunction  A  Hyponatremia / hypochloremia  Hypokalemia   P Replace electrolytes as needed Trend Chemistry  A leukocytosis   P Possibly due to steroids Trend CBC    Kasara Schomer,AG-ACNP Pulmonary & Critical Care Pulmonary and Critical Care Medicine [facility] Pager: [phone]  11/30/2015, 10:23 AM

## 2015-11-30 NOTE — Progress Notes (Signed)
Utilization review completed.  

## 2015-12-01 LAB — CBC WITH DIFFERENTIAL/PLATELET
BASOS ABS: 0.1 10*3/uL (ref 0.0–0.1)
Basophils Relative: 1 %
Eosinophils Absolute: 0.1 10*3/uL (ref 0.0–0.7)
Eosinophils Relative: 1 %
HEMATOCRIT: 36 % (ref 36.0–46.0)
Hemoglobin: 12.1 g/dL (ref 12.0–15.0)
LYMPHS ABS: 2 10*3/uL (ref 0.7–4.0)
Lymphocytes Relative: 18 %
MCH: 30.7 pg (ref 26.0–34.0)
MCHC: 33.6 g/dL (ref 30.0–36.0)
MCV: 91.4 fL (ref 78.0–100.0)
MONO ABS: 1.4 10*3/uL — AB (ref 0.1–1.0)
Monocytes Relative: 13 %
Neutro Abs: 7.5 10*3/uL (ref 1.7–7.7)
Neutrophils Relative %: 67 %
Platelets: 281 10*3/uL (ref 150–400)
RBC: 3.94 MIL/uL (ref 3.87–5.11)
RDW: 13.7 % (ref 11.5–15.5)
WBC: 11.1 10*3/uL — AB (ref 4.0–10.5)

## 2015-12-01 LAB — BASIC METABOLIC PANEL
Anion gap: 9 (ref 5–15)
BUN: 5 mg/dL — AB (ref 6–20)
CALCIUM: 8.9 mg/dL (ref 8.9–10.3)
CO2: 32 mmol/L (ref 22–32)
CREATININE: 0.54 mg/dL (ref 0.44–1.00)
Chloride: 90 mmol/L — ABNORMAL LOW (ref 101–111)
GFR calc non Af Amer: >60 mL/min (ref >60)
Glucose, Bld: 112 mg/dL — ABNORMAL HIGH (ref 65–99)
Potassium: 3.4 mmol/L — ABNORMAL LOW (ref 3.5–5.1)
SODIUM: 131 mmol/L — AB (ref 135–145)

## 2015-12-01 MED ORDER — IPRATROPIUM-ALBUTEROL 18-103 MCG/ACT IN AERO: 2.0000 | INHALATION_SPRAY | Freq: Four times a day (QID) | RESPIRATORY_TRACT | Status: AC

## 2015-12-01 MED ORDER — PREDNISONE 10 MG PO TABS: ORAL_TABLET | ORAL | Status: AC

## 2015-12-01 MED ORDER — POTASSIUM CHLORIDE CRYS ER 20 MEQ PO TBCR
40.0000 meq | EXTENDED_RELEASE_TABLET | Freq: Once | ORAL | Status: AC
  Administered 2015-12-01: 40 meq via ORAL
  Filled 2015-12-01: qty 2

## 2015-12-01 NOTE — Care Management Note (Signed)
Case Management Note Previous CM note initiated by Raynald BlendSamantha Claxton RN, CM  Patient Details  Name: Oleh GeninRhonda Tritch MRN: [id number] Date of Birth: [birth year]  Subjective/Objective:         Pt admitted with acute respiratory failure with hypoxemia           Action/Plan:  PTA- pt was independent - pt lives [place] elderly mother in [place].  CM will continue to monitor for disposition needs   Expected Discharge Date:                  Expected Discharge Plan:  Home/Self Care  In-House Referral:     Discharge planning Services  CM Consult, MATCH Program, Follow-up appt scheduled  Post Acute Care Choice:    Choice offered to:     DME Arranged:    DME Agency:     HH Arranged:    HH Agency:     Status of Service:  Completed, signed off  Medicare Important Message Given:    Date Medicare IM Given:    Medicare IM give by:    Date Additional Medicare IM Given:    Additional Medicare Important Message give by:     If discussed at Long Length of Stay Meetings, dates discussed:    Additional Comments:  12/01/15- 1445- Donn PieriniKristi Shirleyann Montero RN, BSN- pt for possible d/c later today- spoke with NP- Brandi O. This am- regarding d/c needs- pt ambulating sats 90% so pt will not need home 02- pt has been made a f/u appointment [facility] in [place] for 3/9- 2:30 copay will be $40 and she  Will need to bring ID and d/c summary- this info was given to pt along with printed info on clinic- pt also was given [facility] to assist with medication - explained to pt and daughter (on TC) that they would need to use a pharmacy on provided list prior to headed home in order for the [facility] to work in assisting pt with meds. Both voiced understanding-   Zenda AlpersWebster, Lenn SinkKristi Hall, RN 12/01/2015, 2:54 PM

## 2015-12-01 NOTE — Progress Notes (Signed)
Patient assessed at bedside for discharge.  Ambulatory O2 assessment pending.  Arranged follow up at [facility] in [place].  Patient declines follow up in [place].  Discussed case with Care Management.  Await O2 needs assessment.     Canary BrimBrandi Bebe Moncure, NP-C [place] [facility] Pgr: [phone] or if no answer [phone] 12/01/2015, 10:39 AM

## 2015-12-01 NOTE — Progress Notes (Signed)
Pt. 97% says on 2LNC, 95% on RA.  Pt. Walked 250 ft and sat down to 90%

## 2015-12-01 NOTE — Progress Notes (Signed)
   Name: Taylor Mays MRN: [id number] DOB: 04-13-1951    ADMISSION DATE:  11/27/2015   CONSULTATION DATE: 11/27/15  REFERRING MD :  Dr. Bary RichardFienstein  CHIEF COMPLAINT: Shortness of breath     BRIEF PATIENT DESCRIPTION: 65 y/o female with influenza and a COPD exacerbation (presumed, has no baseline PFTs but has smoked). Extubated 2/28.  SIGNIFICANT EVENTS   Intubated 11/27/15  Extubated 11/29/15  STUDIES:  Echo 2/27 >>G1DD, normal EF    SUBJECTIVE:   Sitting up Reduced to 2L [place] - satn 97%  VITAL SIGNS: Temp:  [98.1 F (36.7 C)-98.4 F (36.9 C)] 98.4 F (36.9 C) (03/03 0438) Pulse Rate:  [81-95] 81 (03/03 0438) Resp:  [18-20] 20 (03/03 0438) BP: (105-132)/(66-83) 105/71 mmHg (03/03 1005) SpO2:  [97 %-100 %] 97 % (03/03 0843) Weight:  [186 lb 4.6 oz (84.5 kg)] 186 lb 4.6 oz (84.5 kg) (03/03 0438)  PHYSICAL EXAMINATION: General:  Elderly white female, sitting  Neuro:  Awake, alert,PERRL HEENT:  Atraumatic, normocephalic,sclera white, no discharge Cardiovascular:  S1,S2, rrr, no MRG noted Lungs:  Diminished bases but otherwise clear, no wheezes crackles Abdomen: obese, protrubent, soft, nontender Musculoskeletal:  Good tone Skin: no rash , ecchymosis, skin intact   Recent Labs Lab 11/29/15 1411 11/30/15 0345 12/01/15 0320  NA 132* 132* 131*  K 3.7 3.4* 3.4*  CL 90* 92* 90*  CO2 33* 32 32  BUN <5* <5* 5*  CREATININE 0.64 0.54 0.54  GLUCOSE 138* 108* 112*    Recent Labs Lab 11/28/15 0213 11/30/15 0345 12/01/15 0320  HGB 11.8* 12.3 12.1  HCT 35.6* 36.8 36.0  WBC 10.6* 11.1* 11.1*  PLT 138* 223 281   No results found.  ASSESSMENT / PLAN: A Acute hypoxemic / hypercapneic respiratory failure requiring intubation > improved Presumed COPD exacerbation - no pfts on file, but had significant wheezing Flu positive Extubated on 2/28 P Wean O2 as tolerated.- chk satn on RA Continue dulera/duoneb Continue Tamiflu Started prednisone 30mg  on 11/30/15, taper  over 2 weeks  PFT as outpatient  A Acute (on chronic?) diastolic heart failure exacerbation - BNP >2000 Hypertension - improved  Echo  2/27 with normal EF, grade 1 diastolic dysfunction P Dc lisinopril Continue to hold lasix   A  Hyponatremia / hypochloremia  Hypokalemia   P Replace electrolytes as needed Trend Chemistry   Check saturations on room air- if good she is stable for discharge. She is from [place] and prefers follow-up there We can send her out on Combivent MDI and a slow prednisone taper over 2 weeks  Cyril Mourningakesh Alva MD. Tonny BollmanFCCP. [place] [facility] Pager [phone] If no response call [phone]     12/01/2015, 12:07 PM

## 2015-12-01 NOTE — Discharge Summary (Signed)
Physician Discharge Summary  Patient ID: Taylor Mays MRN: [id number] DOB/AGE: [birth year] 65 y.o.  Admit date: 11/27/2015 Discharge date: 12/01/2015    Discharge Diagnoses:  Acute Hypoxemic / Hypercapnic Respiratory Failure Presumed COPD (no PFT's) Exacerbation Influenza A Positive Acute Diastolic Heart Failure  Hypertension Hyponatremia  Hypochloremia Hypokalemia                                                                         DISCHARGE PLAN BY DIAGNOSIS     Acute hypoxemic / hypercapneic respiratory failure requiring intubation in the setting of AECOPD Presumed COPD exacerbation - no pfts on file, but had significant wheezing Flu positive  Discharge Plan: Does not desaturate with ambulatory assessment, remains greater than 90% Continue Combivent Q6 at discharge (uninsured, not working) Completed Tamiflu Started prednisone 30m on 11/30/15, taper over 2 weeks PFT's needed as an outpatient Referral to primary care follow up arranged  Match letter given for medication prescriptions   Acute (on chronic?) diastolic heart failure exacerbation - BNP >2000.  Echo 2/27 with normal EF, grade 1 diastolic dysfunction Hypertension - improved  Discharge Plan: D/c lisinopril Follow up BP with PCP Clinic (arranged) Continue to hold lasix   Hyponatremia / hypochloremia  Hypokalemia   Discharge Plan: Resolved, no acute follow up at this time.                   DISCHARGE SUMMARY   Taylor Mays a 65y.o. y/o female, current smoker, ? ETOH use with a PMH of CHF and COPD (not defined) [facility] with reports of shortness of breath.    The patient was treated with steroids, breathing treatments and nitro paste in route.  On arrival to the ED she was febrile to 101.5, tachy in the 120s, tachypneic in the 40s and hypertensive at 163/80. Sats were 88% on room air. She was placed on BiPAP with some improvement. She was then  transitioned to CPAP briefly, but had clinical worsening with increased work of breathing and use of accessory muscles. The decision was ultimately made to intubate.  Post intubation, she remained dyssynchronous on the ventilator requiring high dose versed drip (up to 160mhr on arrival).  Labs were remarkable for LA 3.4, trop 0.07, WBC 14.7, Hgb 14.3, PLT 166. INR 0.96. UA benign. ABG 7.30 / 45.5 / 205 / 24. Cr. 0.9, K 4.0, HCO3 27.3, Na 118, Cl 80. Repeat trop 0.08. BNP 2443. Repeat Lactic acid 1.2. The patient was found to be influenza A positive with a suspected COPD exacerbation.    She was treated with Tamilflu, steroids, and nebulized bronchodilators.  Blood cultures to date are negative.  Ultimately, she weaned and was liberated from mechanical ventilation (2/28).  The patient was felt to have a component of diastolic dysfunction and diuresed with improvement.  ECHO assessment demonstrated grade 1 diastolic dysfunction, normal ejection fraction.  She initially had issues with hypertension but returned to normal prior to discharge.  Lisinopril that was initiated in hospital was discontinued.  The patient was assessed for ambulatory oxygen needs and saturations remained greater than 90%.   She met discharge criteria 3/3 with plans as above.  SIGNIFICANT DIAGNOSTIC STUDIES 2/27  ECHO 2/27 >> Grade 1 diastolic dysfunction, normal EF  MICRO DATA  2/27 RVP >> positive for influenza A 2/27 BCx2 >>   TUBES / LINES ETT 2/27 >> 3/1    Discharge Exam: General: chronically ill appearing female in NAD, sitting up at bedside Neuro: AAOx4, speech clear, MAE CV: s1s2 rrr, no m/r/g PULM: even/non-labored, lungs bilaterally diminished but clear GI: obese/soft, bsx4 active, tolerating PO's Extremities: warm/dry, no edema  Filed Vitals:   12/01/15 1005 12/01/15 1300 12/01/15 1334 12/01/15 1336  BP: 105/71 113/72    Pulse:  109    Temp:  98 F (36.7 C)    TempSrc:  Oral    Resp:  18     Height:      Weight:      SpO2:  95% 90% 92%     Discharge Labs  BMET  Recent Labs Lab 11/27/15 0835 11/28/15 0213 11/29/15 1411 11/30/15 0345 12/01/15 0320  NA 126* 127* 132* 132* 131*  K 3.3* 3.1* 3.7 3.4* 3.4*  CL 89* 89* 90* 92* 90*  CO2 25 25 33* 32 32  GLUCOSE 128* 131* 138* 108* 112*  BUN 11 12 <5* <5* 5*  CREATININE 0.89 0.99 0.64 0.54 0.54  CALCIUM 8.1* 8.2* 8.7* 8.7* 8.9  MG 1.9  --   --   --   --   PHOS 2.8  --   --   --   --    CBC  Recent Labs Lab 11/28/15 0213 11/30/15 0345 12/01/15 0320  HGB 11.8* 12.3 12.1  HCT 35.6* 36.8 36.0  WBC 10.6* 11.1* 11.1*  PLT 138* 223 281        Follow-up Information    Follow up with [facility]  On 12/07/2015.   Why:  Appt at 2:30 PM.  Please arrive at 2:00 PM for paper work.  You will need to bring 40$ with you to the appointment.  The clinic will work with you on the cost, they have paper work that will help you have medical care based on income.     Contact information:   [address], [place] [address]  Telephone: [phone]         Medication List    TAKE these medications        albuterol-ipratropium 18-103 MCG/ACT inhaler  Commonly known as:  COMBIVENT  Inhale 2 puffs into the lungs every 6 (six) hours.     predniSONE 10 MG tablet  Commonly known as:  DELTASONE  3 tabs for 4 days, then 2 tabs for 4 days, then 1 tab for 4 days          Disposition:  Home.  No new home health needs identified prior to discharge.   Discharged Condition: Taylor Mays has met maximum benefit of inpatient care and is medically stable and cleared for discharge.  Patient is pending follow up as above.      Time spent on disposition:  Greater than 35 minutes.   Signed: Noe Gens, NP-C [place] [facility] Pgr: [phone] Office: [phone]

## 2015-12-03 LAB — CULTURE, BLOOD (ROUTINE X 2)
Culture: NO GROWTH
Culture: NO GROWTH

## 2017-08-12 IMAGING — DX DG CHEST 1V PORT
1 series · 1 of 1 positions shown · non-contrast
Comparison: None available

CLINICAL DATA: ETT PLACEMENT

EXAM:
PORTABLE CHEST - 1 VIEW

[chest ap]
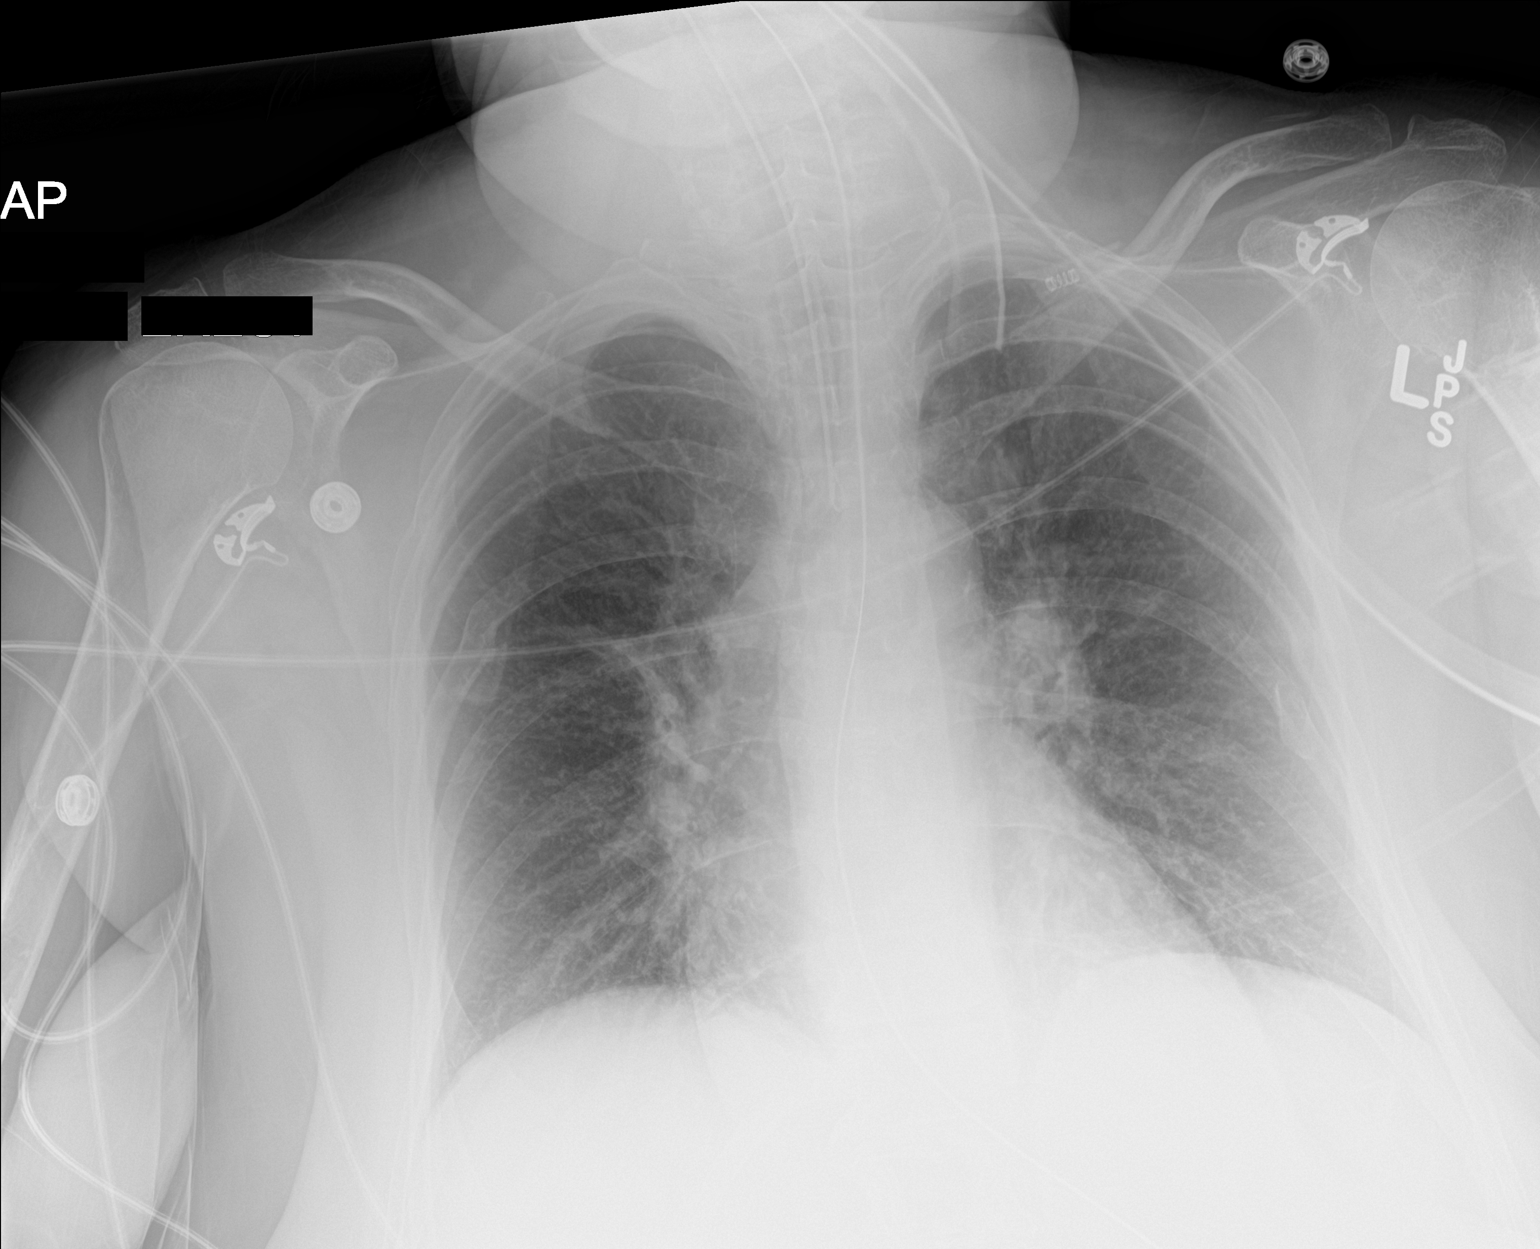

[1 of 1 positions shown; findings below may reference images not displayed]

FINDINGS: Endotracheal tube tip 3.5 cm above carina. Nasogastric tube extends
at least far as the stomach, tip not seen. Relatively low lung
volumes with prominent bibasilar interstitial markings, crowding of
perihilar bronchovascular structures. No confluent airspace disease.
Heart size normal. No effusion. No pneumothorax. Old fractures of
left sixth and seventh ribs, right sixth rib.
IMPRESSION: 1. Endotracheal tube and nasogastric tube in expected location.
2. Low volumes with perihilar atelectasis.
